# Patient Record
Sex: Female | Born: 1985 | Race: White | Hispanic: No | Marital: Single | Smoking: Never smoker
Health system: Southern US, Community
[De-identification: ages and names within clinical notes are randomized; demographics above are authoritative.]

## PROBLEM LIST (undated history)

## (undated) DIAGNOSIS — F419 Anxiety disorder, unspecified: Secondary | ICD-10-CM

## (undated) HISTORY — PX: CHOLECYSTECTOMY: SHX55

## (undated) HISTORY — DX: Anxiety disorder, unspecified: F41.9

---

## 2004-12-31 ENCOUNTER — Ambulatory Visit: Payer: Self-pay | Admitting: Internal Medicine

## 2005-02-20 ENCOUNTER — Encounter: Payer: Self-pay | Admitting: Physician Assistant

## 2005-03-04 ENCOUNTER — Encounter: Payer: Self-pay | Admitting: Physician Assistant

## 2005-04-04 ENCOUNTER — Encounter: Payer: Self-pay | Admitting: Physician Assistant

## 2007-06-29 ENCOUNTER — Observation Stay: Payer: Self-pay | Admitting: Obstetrics & Gynecology

## 2007-08-18 ENCOUNTER — Observation Stay: Payer: Self-pay

## 2007-08-23 ENCOUNTER — Inpatient Hospital Stay: Payer: Self-pay | Admitting: Obstetrics and Gynecology

## 2008-09-08 ENCOUNTER — Ambulatory Visit: Payer: Self-pay | Admitting: Family Medicine

## 2008-09-14 ENCOUNTER — Ambulatory Visit: Payer: Self-pay | Admitting: Family Medicine

## 2008-09-21 ENCOUNTER — Ambulatory Visit: Payer: Self-pay | Admitting: General Surgery

## 2008-10-03 ENCOUNTER — Ambulatory Visit: Payer: Self-pay | Admitting: General Surgery

## 2008-10-03 IMAGING — CR DG CHOLANGIOGRAM OPERATIVE
1 series · 1 of 1 positions shown · non-contrast
Comparison: none

REASON FOR EXAM: cholelithiasis
COMMENTS:

[imported/digitized images]
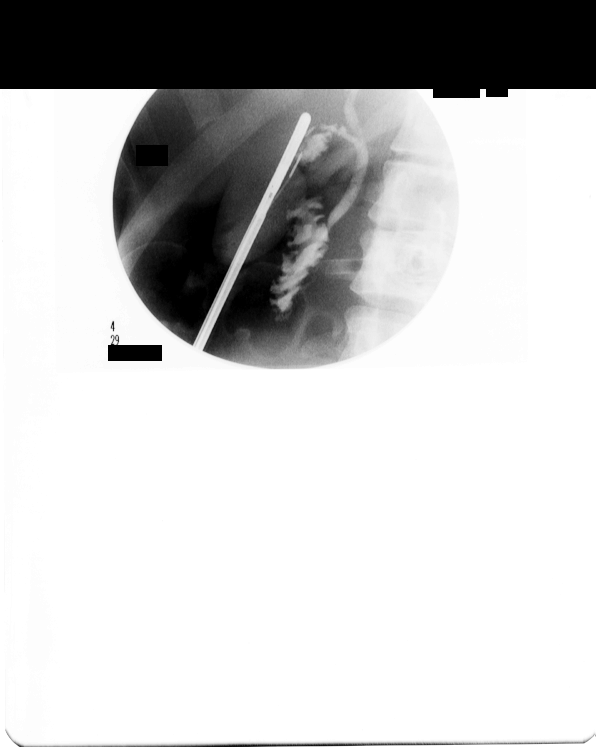

[1 of 1 positions shown; findings below may reference images not displayed]

PROCEDURE:     DXR - DXR CHOLANGIOGRAM OP (INITIAL)  - October 03, 2008 [DATE]

RESULT:     Contrast material is visualized in the hepatic ducts and common
duct. No definite retained stone is seen. There are a few lucencies
projected over the lower common duct which are thought to the artifactual.
Contrast is noted to flow into the duodenum without evidence of obstruction.
IMPRESSION: Normal operative sonogram.

## 2009-03-08 ENCOUNTER — Emergency Department: Payer: Self-pay | Admitting: Emergency Medicine

## 2009-08-23 ENCOUNTER — Inpatient Hospital Stay (HOSPITAL_COMMUNITY): Admission: RE | Admit: 2009-08-23 | Discharge: 2009-08-27 | Payer: Self-pay | Admitting: Obstetrics and Gynecology

## 2009-09-16 IMAGING — US ABDOMEN ULTRASOUND
1 series · 2 of 2 positions shown · non-contrast
Comparison: none

REASON FOR EXAM: abdominal pain   RUQ pain  back pain    Shortness of
breath    chest pain   eval reflux
COMMENTS:

[Series 1: abdomen ultrasound · 2 of 2 slices shown]
[im 1/2]
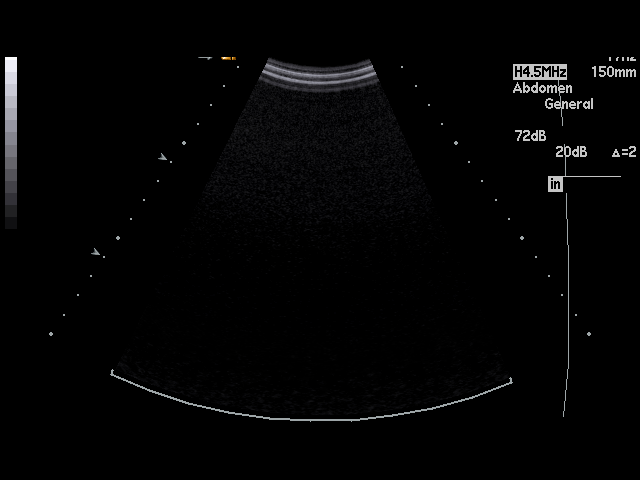
[im 2/2]
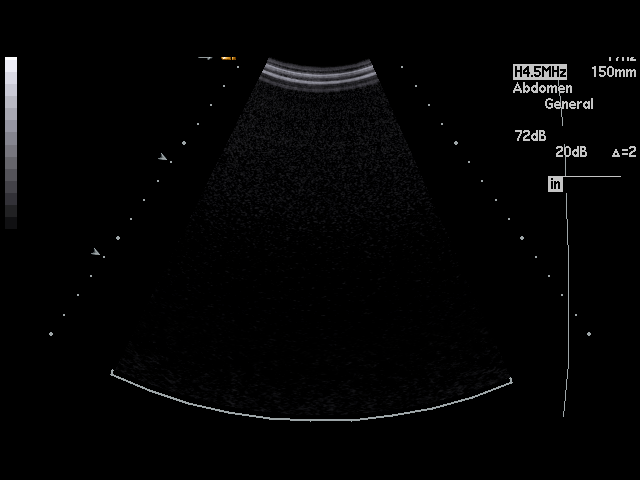

[2 of 2 positions shown; findings below may reference images not displayed]

PROCEDURE:     US  - US ABDOMEN GENERAL SURVEY  - September 08, 2008  [DATE]

RESULT:     Ultrasound of the abdomen demonstrates atherosclerotic
irregularity in the aorta but no aneurysm. The pancreas, spleen, liver,
kidneys and common bile duct appear to be unremarkable. The common bile duct
diameter is 4.4 mm. There are mobile shadowing stones within the gallbladder.
IMPRESSION: 1. Cholelithiasis.
2. No other significant abnormality.

## 2011-02-07 LAB — CBC
HCT: 29 % — ABNORMAL LOW (ref 36.0–46.0)
HCT: 34.5 % — ABNORMAL LOW (ref 36.0–46.0)
Hemoglobin: 11.5 g/dL — ABNORMAL LOW (ref 12.0–15.0)
Hemoglobin: 9.6 g/dL — ABNORMAL LOW (ref 12.0–15.0)
MCHC: 33.1 g/dL (ref 30.0–36.0)
MCHC: 33.3 g/dL (ref 30.0–36.0)
MCV: 80.1 fL (ref 78.0–100.0)
MCV: 80.8 fL (ref 78.0–100.0)
Platelets: 213 10*3/uL (ref 150–400)
RBC: 4.31 MIL/uL (ref 3.87–5.11)
RDW: 14.8 % (ref 11.5–15.5)

## 2016-07-02 ENCOUNTER — Ambulatory Visit: Payer: Self-pay | Admitting: Physician Assistant

## 2016-07-02 ENCOUNTER — Encounter: Payer: Self-pay | Admitting: Physician Assistant

## 2016-07-02 VITALS — BP 110/70 | HR 82 | Temp 98.9°F

## 2016-07-02 DIAGNOSIS — W57XXXA Bitten or stung by nonvenomous insect and other nonvenomous arthropods, initial encounter: Secondary | ICD-10-CM
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
## 2016-07-03 ENCOUNTER — Other Ambulatory Visit: Payer: Self-pay | Admitting: Physician Assistant

## 2016-07-03 DIAGNOSIS — Z299 Encounter for prophylactic measures, unspecified: Secondary | ICD-10-CM

## 2016-07-03 NOTE — Progress Notes (Signed)
Patient came in to have blood drawn per Susan's orders. 

## 2016-07-04 LAB — CMP12+LP+TP+TSH+6AC+CBC/D/PLT
A/G RATIO: 1.5 (ref 1.2–2.2)
ALBUMIN: 4.4 g/dL (ref 3.5–5.5)
ALK PHOS: 76 IU/L (ref 39–117)
ALT: 28 IU/L (ref 0–32)
AST: 23 IU/L (ref 0–40)
BASOS ABS: 0 10*3/uL (ref 0.0–0.2)
BASOS: 0 %
BILIRUBIN TOTAL: 0.2 mg/dL (ref 0.0–1.2)
BUN / CREAT RATIO: 14 (ref 9–23)
BUN: 13 mg/dL (ref 6–20)
CHLORIDE: 101 mmol/L (ref 96–106)
CHOLESTEROL TOTAL: 184 mg/dL (ref 100–199)
Calcium: 9.5 mg/dL (ref 8.7–10.2)
Chol/HDL Ratio: 4.7 ratio units — ABNORMAL HIGH (ref 0.0–4.4)
Creatinine, Ser: 0.9 mg/dL (ref 0.57–1.00)
EOS (ABSOLUTE): 0.2 10*3/uL (ref 0.0–0.4)
EOS: 3 %
ESTIMATED CHD RISK: 1.2 times avg. — AB (ref 0.0–1.0)
FREE THYROXINE INDEX: 1.8 (ref 1.2–4.9)
GFR calc non Af Amer: 85 mL/min/{1.73_m2} (ref >59)
GFR, EST AFRICAN AMERICAN: 98 mL/min/{1.73_m2} (ref >59)
GGT: 26 IU/L (ref 0–60)
GLUCOSE: 97 mg/dL (ref 65–99)
Globulin, Total: 2.9 g/dL (ref 1.5–4.5)
HDL: 39 mg/dL — AB (ref >39)
HEMATOCRIT: 43.3 % (ref 34.0–46.6)
HEMOGLOBIN: 14.9 g/dL (ref 11.1–15.9)
IMMATURE GRANS (ABS): 0 10*3/uL (ref 0.0–0.1)
IMMATURE GRANULOCYTES: 1 %
Iron: 67 ug/dL (ref 27–159)
LDH: 168 IU/L (ref 119–226)
LDL CALC: 130 mg/dL — AB (ref 0–99)
LYMPHS: 27 %
Lymphocytes Absolute: 2.1 10*3/uL (ref 0.7–3.1)
MCH: 30.9 pg (ref 26.6–33.0)
MCHC: 34.4 g/dL (ref 31.5–35.7)
MCV: 90 fL (ref 79–97)
MONOCYTES: 7 %
Monocytes Absolute: 0.5 10*3/uL (ref 0.1–0.9)
NEUTROS PCT: 62 %
Neutrophils Absolute: 4.9 10*3/uL (ref 1.4–7.0)
PLATELETS: 260 10*3/uL (ref 150–379)
Phosphorus: 3.6 mg/dL (ref 2.5–4.5)
Potassium: 4.9 mmol/L (ref 3.5–5.2)
RBC: 4.82 x10E6/uL (ref 3.77–5.28)
RDW: 12.9 % (ref 12.3–15.4)
SODIUM: 140 mmol/L (ref 134–144)
T3 UPTAKE RATIO: 24 % (ref 24–39)
T4, Total: 7.5 ug/dL (ref 4.5–12.0)
TSH: 3.21 u[IU]/mL (ref 0.450–4.500)
Total Protein: 7.3 g/dL (ref 6.0–8.5)
Triglycerides: 74 mg/dL (ref 0–149)
URIC ACID: 4.4 mg/dL (ref 2.5–7.1)
VLDL CHOLESTEROL CAL: 15 mg/dL (ref 5–40)
WBC: 7.8 10*3/uL (ref 3.4–10.8)

## 2016-07-04 LAB — VITAMIN D 25 HYDROXY (VIT D DEFICIENCY, FRACTURES): Vit D, 25-Hydroxy: 26.3 ng/mL — ABNORMAL LOW (ref 30.0–100.0)

## 2017-04-02 ENCOUNTER — Ambulatory Visit (INDEPENDENT_AMBULATORY_CARE_PROVIDER_SITE_OTHER): Payer: Managed Care, Other (non HMO) | Admitting: Family Medicine

## 2017-04-02 ENCOUNTER — Encounter: Payer: Self-pay | Admitting: Family Medicine

## 2017-04-02 VITALS — BP 128/88 | HR 82 | Temp 97.7°F | Ht 65.5 in | Wt 187.0 lb

## 2017-04-02 DIAGNOSIS — Z124 Encounter for screening for malignant neoplasm of cervix: Secondary | ICD-10-CM | POA: Diagnosis not present

## 2017-04-02 DIAGNOSIS — Z23 Encounter for immunization: Secondary | ICD-10-CM | POA: Diagnosis not present

## 2017-04-02 DIAGNOSIS — Z0000 Encounter for general adult medical examination without abnormal findings: Secondary | ICD-10-CM

## 2017-04-02 DIAGNOSIS — Z7689 Persons encountering health services in other specified circumstances: Secondary | ICD-10-CM

## 2017-04-02 LAB — UA/M W/RFLX CULTURE, ROUTINE
BILIRUBIN UA: NEGATIVE
GLUCOSE, UA: NEGATIVE
Ketones, UA: NEGATIVE
Leukocytes, UA: NEGATIVE
NITRITE UA: NEGATIVE
PH UA: 5.5 (ref 5.0–7.5)
PROTEIN UA: NEGATIVE
Specific Gravity, UA: 1.025 (ref 1.005–1.030)
UUROB: 0.2 mg/dL (ref 0.2–1.0)

## 2017-04-02 LAB — MICROSCOPIC EXAMINATION
BACTERIA UA: NONE SEEN
WBC UA: NONE SEEN /HPF (ref 0–?)

## 2017-04-02 NOTE — Patient Instructions (Signed)
Activate your mychart account to see lab results online, ask providers questions, request refills, schedule appointments, etc

## 2017-04-02 NOTE — Progress Notes (Signed)
BP 128/88   Pulse 82   Temp 97.7 F (36.5 C)   Ht 5' 5.5" (1.664 m)   Wt 187 lb (84.8 kg)   LMP 03/11/2017 (Exact Date)   SpO2 98%   BMI 30.65 kg/m    Subjective:    Patient ID: Debra Serrano, female    DOB: 11/13/1985, 31 y.o.   MRN: [id number]  HPI: Debra Severanceiffany F Leland is a 31 y.o. female presenting on 04/02/2017 for comprehensive medical examination. Current medical complaints include:none  Patient presents today to establish care. 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 Needing one plus pap smear. No medical problems or routine medications. No concerns today.   Depression Screen done today and results listed below:  [facility] 04/02/2017  Decreased Interest 0  Down, Depressed, Hopeless 0  PHQ - 2 Score 0    The patient does not have a history of falls. I did not complete a risk assessment for falls. A plan of care for falls was not documented.   Past Medical History:  Past Medical History:  Diagnosis Date  . Anxiety     Surgical History:  Past Surgical History:  Procedure Laterality Date  . CESAREAN SECTION     x 2  . CHOLECYSTECTOMY      Medications:  No current outpatient prescriptions on file prior to visit.   No current facility-administered medications on file prior to visit.     Allergies:  No Known Allergies  Social History:  Social History   Social History  . Marital status: Significant Other    Spouse name: N/A  . Number of children: N/A  . Years of education: N/A   Occupational History  . Not on file.   Social History Main Topics  . Smoking status: Never Smoker  . Smokeless tobacco: Never Used  . Alcohol use Yes     Comment: occasionally  . Drug use: No  . Sexual activity: Not on file   Other Topics Concern  . Not on file   Social History Narrative  . No narrative on file   History  Smoking Status  .  Never Smoker  Smokeless Tobacco  . Never Used   History  Alcohol Use  . Yes    Comment: occasionally    Family History:  Family History  Problem Relation Age of Onset  . Cancer Mother        melanoma  . COPD Mother   . Stroke Father   . Cancer Maternal Grandmother   . Cancer Maternal Grandfather   . Cancer Paternal Grandmother   . Cancer Paternal Grandfather   . Diabetes Neg Hx   . Heart disease Neg Hx   . Hypertension Neg Hx     Past medical history, surgical history, medications, allergies, family history and social history reviewed with patient today and changes made to appropriate areas of the chart.   Review of Systems - General ROS: negative Psychological ROS: negative Ophthalmic ROS: negative ENT ROS: negative Allergy and Immunology ROS: negative Hematological and Lymphatic ROS: negative Endocrine ROS: negative Breast ROS: negative for breast lumps Respiratory ROS: no cough, shortness of breath, or wheezing Cardiovascular ROS: no chest pain or dyspnea on exertion Gastrointestinal ROS: no abdominal pain, change in bowel habits, or black or bloody stools Genito-Urinary ROS: no dysuria, trouble voiding, or hematuria Musculoskeletal ROS: negative  Neurological ROS: no TIA or stroke symptoms Dermatological ROS: negative All other ROS negative except what is listed above and in the HPI.      Objective:    BP 128/88   Pulse 82   Temp 97.7 F (36.5 C)   Ht 5' 5.5" (1.664 m)   Wt 187 lb (84.8 kg)   LMP 03/11/2017 (Exact Date)   SpO2 98%   BMI 30.65 kg/m   Wt Readings from Last 3 Encounters:  04/02/17 187 lb (84.8 kg)    Physical Exam  Constitutional: She is oriented to person, place, and time. She appears well-developed and well-nourished. No distress.  HENT:  Head: Atraumatic.  Right Ear: External ear normal.  Left Ear: External ear normal.  Nose: Nose normal.  Mouth/Throat: Oropharynx is clear and moist. No oropharyngeal exudate.  Eyes: Conjunctivae  are normal. Pupils are equal, round, and reactive to light. No scleral icterus.  Neck: Normal range of motion. Neck supple. No thyromegaly present.  Cardiovascular: Normal rate, regular rhythm, normal heart sounds and intact distal pulses.   Pulmonary/Chest: Effort normal and breath sounds normal. No respiratory distress. Right breast exhibits no mass, no skin change and no tenderness. Left breast exhibits no mass, no skin change and no tenderness.  Abdominal: Soft. Bowel sounds are normal. She exhibits no distension. There is no tenderness. There is no rebound.  Genitourinary: Vagina normal and uterus normal.  Musculoskeletal: Normal range of motion. She exhibits no edema or tenderness.  Lymphadenopathy:    She has no cervical adenopathy.    She has no axillary adenopathy.  Neurological: She is alert and oriented to person, place, and time. No cranial nerve deficit.  Skin: Skin is warm and dry. No rash noted. No erythema.  Psychiatric: She has a normal mood and affect. Her behavior is normal.  Nursing note and vitals reviewed.     Assessment & Plan:   Problem List Items Addressed This Visit    None    Visit Diagnoses    Encounter to establish care    -  Primary   No concerns today. CPE completed   Annual physical exam       Pap and Td completed today, await basic labs.    Relevant Orders   CBC with Differential/Platelet   Comprehensive metabolic panel   Lipid Panel w/o Chol/HDL Ratio   TSH   UA/M w/rflx Culture, Routine   Screening for cervical cancer       Relevant Orders   IGP, Aptima HPV, rfx 16/18,45   Need for diphtheria-tetanus-pertussis (Tdap) vaccine       Relevant Orders   Tdap vaccine greater than or equal to 7yo IM (Completed)       Follow up plan: Return in about 1 year (around 04/02/2018) for CPE.   LABORATORY TESTING:  - Pap smear: pap done  IMMUNIZATIONS:   - Tdap: Tetanus vaccination status reviewed: Td vaccination indicated and given today. -  Influenza: Postponed to flu season  PATIENT COUNSELING:   Advised to take 1 mg of folate supplement per day if capable of pregnancy.   Sexuality: Discussed sexually transmitted diseases, partner selection, use of condoms, avoidance of unintended pregnancy  and contraceptive alternatives.   Advised to avoid cigarette smoking.  I discussed with the patient that most people either abstain from alcohol or drink within safe limits (<=14/week and <=4 drinks/occasion for males, <=7/weeks and <= 3 drinks/occasion for females) and that the risk for alcohol disorders and other health effects  rises proportionally with the number of drinks per week and how often a drinker exceeds daily limits.  Discussed cessation/primary prevention of drug use and availability of treatment for abuse.   Diet: Encouraged to adjust caloric intake to maintain  or achieve ideal body weight, to reduce intake of dietary saturated fat and total fat, to limit sodium intake by avoiding high sodium foods and not adding table salt, and to maintain adequate dietary potassium and calcium preferably from fresh fruits, vegetables, and low-fat dairy products.    stressed the importance of regular exercise  Injury prevention: Discussed safety belts, safety helmets, smoke detector, smoking near bedding or upholstery.   Dental health: Discussed importance of regular tooth brushing, flossing, and dental visits.    NEXT PREVENTATIVE PHYSICAL DUE IN 1 YEAR. Return in about 1 year (around 04/02/2018) for CPE.

## 2017-04-03 ENCOUNTER — Encounter: Payer: Self-pay | Admitting: Family Medicine

## 2017-04-03 LAB — COMPREHENSIVE METABOLIC PANEL
ALK PHOS: 88 IU/L (ref 39–117)
ALT: 30 IU/L (ref 0–32)
AST: 24 IU/L (ref 0–40)
Albumin/Globulin Ratio: 1.5 (ref 1.2–2.2)
Albumin: 4.4 g/dL (ref 3.5–5.5)
BILIRUBIN TOTAL: 0.3 mg/dL (ref 0.0–1.2)
BUN/Creatinine Ratio: 19 (ref 9–23)
BUN: 17 mg/dL (ref 6–20)
CO2: 23 mmol/L (ref 18–29)
CREATININE: 0.9 mg/dL (ref 0.57–1.00)
Calcium: 9.3 mg/dL (ref 8.7–10.2)
Chloride: 100 mmol/L (ref 96–106)
GFR calc non Af Amer: 85 mL/min/{1.73_m2} (ref >59)
GFR, EST AFRICAN AMERICAN: 98 mL/min/{1.73_m2} (ref >59)
GLOBULIN, TOTAL: 3 g/dL (ref 1.5–4.5)
Glucose: 93 mg/dL (ref 65–99)
Potassium: 4.3 mmol/L (ref 3.5–5.2)
SODIUM: 141 mmol/L (ref 134–144)
TOTAL PROTEIN: 7.4 g/dL (ref 6.0–8.5)

## 2017-04-03 LAB — CBC WITH DIFFERENTIAL/PLATELET
Basophils Absolute: 0 10*3/uL (ref 0.0–0.2)
Basos: 0 %
EOS (ABSOLUTE): 0.1 10*3/uL (ref 0.0–0.4)
EOS: 1 %
HEMOGLOBIN: 14.9 g/dL (ref 11.1–15.9)
Hematocrit: 42.9 % (ref 34.0–46.6)
Immature Grans (Abs): 0 10*3/uL (ref 0.0–0.1)
Immature Granulocytes: 0 %
LYMPHS ABS: 2.7 10*3/uL (ref 0.7–3.1)
Lymphs: 33 %
MCH: 30.6 pg (ref 26.6–33.0)
MCHC: 34.7 g/dL (ref 31.5–35.7)
MCV: 88 fL (ref 79–97)
MONOCYTES: 4 %
Monocytes Absolute: 0.4 10*3/uL (ref 0.1–0.9)
NEUTROS ABS: 5 10*3/uL (ref 1.4–7.0)
Neutrophils: 62 %
Platelets: 299 10*3/uL (ref 150–379)
RBC: 4.87 x10E6/uL (ref 3.77–5.28)
RDW: 13 % (ref 12.3–15.4)
WBC: 8.2 10*3/uL (ref 3.4–10.8)

## 2017-04-03 LAB — LIPID PANEL W/O CHOL/HDL RATIO
CHOLESTEROL TOTAL: 191 mg/dL (ref 100–199)
HDL: 43 mg/dL (ref >39)
LDL CALC: 133 mg/dL — AB (ref 0–99)
Triglycerides: 76 mg/dL (ref 0–149)
VLDL Cholesterol Cal: 15 mg/dL (ref 5–40)

## 2017-04-03 LAB — TSH: TSH: 3 u[IU]/mL (ref 0.450–4.500)

## 2017-04-04 LAB — IGP, APTIMA HPV, RFX 16/18,45
HPV APTIMA: NEGATIVE
PAP SMEAR COMMENT: 0

## 2017-04-30 ENCOUNTER — Ambulatory Visit: Payer: Self-pay | Admitting: Family Medicine

## 2017-11-03 ENCOUNTER — Other Ambulatory Visit: Payer: Self-pay | Admitting: Family

## 2017-11-03 ENCOUNTER — Ambulatory Visit: Payer: Self-pay | Admitting: Family

## 2017-11-03 DIAGNOSIS — J019 Acute sinusitis, unspecified: Secondary | ICD-10-CM

## 2017-11-03 DIAGNOSIS — H60502 Unspecified acute noninfective otitis externa, left ear: Secondary | ICD-10-CM

## 2017-11-03 MED ORDER — AMOXICILLIN 875 MG PO TABS: 875.0000 mg | ORAL_TABLET | Freq: Two times a day (BID) | ORAL | 0 refills | Status: DC

## 2017-11-03 MED ORDER — CIPROFLOXACIN-HYDROCORTISONE 0.2-1 % OT SUSP: 3.0000 [drp] | Freq: Two times a day (BID) | OTIC | 0 refills | Status: DC

## 2017-11-03 MED ORDER — CIPROFLOXACIN-FLUOCINOLONE PF 0.3-0.025 % OT SOLN: 0.2500 mL | Freq: Two times a day (BID) | OTIC | Status: DC

## 2017-11-03 NOTE — Addendum Note (Signed)
Addended by: Mirian MoMOORE, TOMMIE A on: 11/03/2017 01:46 PM   Modules accepted: Orders

## 2017-11-03 NOTE — Progress Notes (Signed)
Subjective: 31 year old WF complains of left ear pain about a month but has become worse the past few days with throbbing in her teeth; she reports having a bout of an upper respiratory infection initially with improvement then hurt left ear began hurting at a low level. She denies fever, headache, or difficulty eating or drinking  O: vital signs are stable,alert, healthy, pleasant ,no acute distress  ENT right TM is retracted; left EAC with excoriation and white whitish debris, canal is tender to touch and is painful with the manipulation of the earlobe, left preauricular tenderness, left TM is not visualized secondary to the debris, nasal mucosa extremely erythematous, swollen excoriated and with exudate, sinuses are not tender to palpation, oral cavity unremarkable, neck supple without adenopathy; heart RSR, lungs are clear  A/: Left otitis externa, acute Acute rhinosinusitis P/: Rx for Cipro otic twice a day 7 days with wick, amoxicillin 875 mg twice a day 10 days. Asking her to return for recheck at the completion of her antibiotics or as needed if symptoms are not improving.Supportive measures discussed. Follow up prn not improving

## 2018-03-12 ENCOUNTER — Telehealth: Payer: Self-pay | Admitting: Family Medicine

## 2018-03-12 NOTE — Telephone Encounter (Signed)
It is fine to have a physical, but she can also wait until after her baby is born. She will not have a pelvic exam here.

## 2018-03-12 NOTE — Telephone Encounter (Signed)
Copied from CRM [id number]. Topic: Appointment Scheduling - Scheduling Inquiry for Clinic >> Mar 12, 2018  3:41 PM Arlyss Gandy, NT wrote: Reason for CRM: Pt is [redacted] weeks pregnant and currently seeing a OB. Wants to be sure that she is ok to have a physical while pregnant or wait until the baby is born. Please advise.

## 2018-03-16 NOTE — Telephone Encounter (Signed)
LVM for patient to return call regarding information.

## 2018-03-17 NOTE — Telephone Encounter (Signed)
Called and left patient a VM asking for her to please return my call.  

## 2018-03-18 NOTE — Telephone Encounter (Signed)
Tried calling patient again. No answer. Did not leave another VM because me and [place] have left one. Patient has not returned phone call.  Letter?

## 2018-03-19 NOTE — Telephone Encounter (Signed)
-   As below

## 2018-03-19 NOTE — Telephone Encounter (Signed)
We can send a letter, or just wait until her appointment

## 2018-03-19 NOTE — Telephone Encounter (Signed)
Will wait for appointment as there are many voicemails left on phone. Appt 04/06/18 Patient can call back.

## 2018-04-06 ENCOUNTER — Encounter: Payer: Managed Care, Other (non HMO) | Admitting: Family Medicine

## 2018-10-07 DIAGNOSIS — Z30430 Encounter for insertion of intrauterine contraceptive device: Secondary | ICD-10-CM | POA: Insufficient documentation

## 2019-05-24 ENCOUNTER — Encounter: Payer: Managed Care, Other (non HMO) | Admitting: Adult Health

## 2019-05-24 ENCOUNTER — Encounter: Payer: Self-pay | Admitting: Adult Health

## 2019-06-08 ENCOUNTER — Encounter: Payer: Self-pay | Admitting: Adult Health

## 2019-06-08 ENCOUNTER — Other Ambulatory Visit: Payer: Self-pay

## 2019-06-08 ENCOUNTER — Ambulatory Visit: Payer: Managed Care, Other (non HMO) | Admitting: Adult Health

## 2019-06-08 VITALS — BP 120/78 | HR 72 | Temp 97.5°F | Resp 16 | Ht 66.0 in | Wt 196.0 lb

## 2019-06-08 DIAGNOSIS — Z0189 Encounter for other specified special examinations: Secondary | ICD-10-CM

## 2019-06-08 DIAGNOSIS — Z008 Encounter for other general examination: Secondary | ICD-10-CM

## 2019-06-08 NOTE — Patient Instructions (Signed)
The Biometric exam is a brief physical with labs including glucose and cholesterol. This does not replace a full physical with a primary care provider, and additional recommended labs. This is an acute care clinic not for maintenance of chronic or long standing conditions.   Provider also recommends if you do not have a primary care provider for patient to establish care promptly.You can choose any provider of your choice at any facility of your choice, the below information is  just a resource to aid in you finding a primary care provider for routine health maintenance.   [facility]  PHYSICIAN/PROVIDER  REFERRAL LINE at [phone]  [email or web address] to help assist with finding a primary care doctor.   Helpful resources below of other primary care office's accepting new patients.   Carlyon Prows         [address]  [place] [address] [phone]  . [facility]    [address], [place], [place] [address] [phone]  . [facility] [address] [place], [place]  [phone]   . [place] [facility]  [address] [place] [address] [phone]    Follow up with primary care as needed for chronic and maintenance health care- can be seen in this employee clinic for acute care.      Calorie Counting for Weight Loss Calories are units of energy. Your body needs a certain amount of calories from food to keep you going throughout the day. When you eat more calories than your body needs, your body stores the extra calories as fat. When you eat fewer calories than your body needs, your body burns fat to get the energy it needs. Calorie counting means keeping track of how many calories you eat and drink each day. Calorie counting can be helpful if you need to lose weight. If you make sure to eat fewer calories than your body needs, you should  lose weight. Ask your health care provider what a healthy weight is for you. For calorie counting to work, you will need to eat the right number of calories in a day in order to lose a healthy amount of weight per week. A dietitian can help you determine how many calories you need in a day and will give you suggestions on how to reach your calorie goal.  A healthy amount of weight to lose per week is usually 1-2 lb (0.5-0.9 kg). This usually means that your daily calorie intake should be reduced by 500-750 calories.  Eating 1,200 - 1,500 calories per day can help most women lose weight.  Eating 1,500 - 1,800 calories per day can help most men lose weight. What is my plan? My goal is to have __________ calories per day. If I have this many calories per day, I should lose around __________ pounds per week. What do I need to know about calorie counting? In order to meet your daily calorie goal, you will need to:  Find out how many calories are in each food you would like to eat. Try to do this before you eat.  Decide how much of the food you plan to eat.  Write down what you ate and how many calories it had. Doing this is called keeping a food log. To successfully lose weight, it is important to balance calorie counting with a healthy lifestyle that includes regular activity. Aim for  150 minutes of moderate exercise (such as walking) or 75 minutes of vigorous exercise (such as running) each week. Where do I find calorie information?  The number of calories in a food can be found on a Nutrition Facts label. If a food does not have a Nutrition Facts label, try to look up the calories online or ask your dietitian for help. Remember that calories are listed per serving. If you choose to have more than one serving of a food, you will have to multiply the calories per serving by the amount of servings you plan to eat. For example, the label on a package of bread might say that a serving size is 1 slice  and that there are 90 calories in a serving. If you eat 1 slice, you will have eaten 90 calories. If you eat 2 slices, you will have eaten 180 calories. How do I keep a food log? Immediately after each meal, record the following information in your food log:  What you ate. Don't forget to include toppings, sauces, and other extras on the food.  How much you ate. This can be measured in cups, ounces, or number of items.  How many calories each food and drink had.  The total number of calories in the meal. Keep your food log near you, such as in a small notebook in your pocket, or use a mobile app or website. Some programs will calculate calories for you and show you how many calories you have left for the day to meet your goal. What are some calorie counting tips?   Use your calories on foods and drinks that will fill you up and not leave you hungry: ? Some examples of foods that fill you up are nuts and nut butters, vegetables, lean proteins, and high-fiber foods like whole grains. High-fiber foods are foods with more than 5 g fiber per serving. ? Drinks such as sodas, specialty coffee drinks, alcohol, and juices have a lot of calories, yet do not fill you up.  Eat nutritious foods and avoid empty calories. Empty calories are calories you get from foods or beverages that do not have many vitamins or protein, such as candy, sweets, and soda. It is better to have a nutritious high-calorie food (such as an avocado) than a food with few nutrients (such as a bag of chips).  Know how many calories are in the foods you eat most often. This will help you calculate calorie counts faster.  Pay attention to calories in drinks. Low-calorie drinks include water and unsweetened drinks.  Pay attention to nutrition labels for "low fat" or "fat free" foods. These foods sometimes have the same amount of calories or more calories than the full fat versions. They also often have added sugar, starch, or salt, to  make up for flavor that was removed with the fat.  Find a way of tracking calories that works for you. Get creative. Try different apps or programs if writing down calories does not work for you. What are some portion control tips?  Know how many calories are in a serving. This will help you know how many servings of a certain food you can have.  Use a measuring cup to measure serving sizes. You could also try weighing out portions on a kitchen scale. With time, you will be able to estimate serving sizes for some foods.  Take some time to put servings of different foods on your favorite plates, bowls, and cups so you know  what a serving looks like.  Try not to eat straight from a bag or box. Doing this can lead to overeating. Put the amount you would like to eat in a cup or on a plate to make sure you are eating the right portion.  Use smaller plates, glasses, and bowls to prevent overeating.  Try not to multitask (for example, watch TV or use your computer) while eating. If it is time to eat, sit down at a table and enjoy your food. This will help you to know when you are full. It will also help you to be aware of what you are eating and how much you are eating. What are tips for following this plan? Reading food labels  Check the calorie count compared to the serving size. The serving size may be smaller than what you are used to eating.  Check the source of the calories. Make sure the food you are eating is high in vitamins and protein and low in saturated and trans fats. Shopping  Read nutrition labels while you shop. This will help you make healthy decisions before you decide to purchase your food.  Make a grocery list and stick to it. Cooking  Try to cook your favorite foods in a healthier way. For example, try baking instead of frying.  Use low-fat dairy products. Meal planning  Use more fruits and vegetables. Half of your plate should be fruits and vegetables.  Include  lean proteins like poultry and fish. How do I count calories when eating out?  Ask for smaller portion sizes.  Consider sharing an entree and sides instead of getting your own entree.  If you get your own entree, eat only half. Ask for a box at the beginning of your meal and put the rest of your entree in it so you are not tempted to eat it.  If calories are listed on the menu, choose the lower calorie options.  Choose dishes that include vegetables, fruits, whole grains, low-fat dairy products, and lean protein.  Choose items that are boiled, broiled, grilled, or steamed. Stay away from items that are buttered, battered, fried, or served with cream sauce. Items labeled "crispy" are usually fried, unless stated otherwise.  Choose water, low-fat milk, unsweetened iced tea, or other drinks without added sugar. If you want an alcoholic beverage, choose a lower calorie option such as a glass of wine or light beer.  Ask for dressings, sauces, and syrups on the side. These are usually high in calories, so you should limit the amount you eat.  If you want a salad, choose a garden salad and ask for grilled meats. Avoid extra toppings like bacon, cheese, or fried items. Ask for the dressing on the side, or ask for olive oil and vinegar or lemon to use as dressing.  Estimate how many servings of a food you are given. For example, a serving of cooked rice is  cup or about the size of half a baseball. Knowing serving sizes will help you be aware of how much food you are eating at restaurants. The list below tells you how big or small some common portion sizes are based on everyday objects: ? 1 oz-4 stacked dice. ? 3 oz-1 deck of cards. ? 1 tsp-1 die. ? 1 Tbsp- a ping-pong ball. ? 2 Tbsp-1 ping-pong ball. ?  cup- baseball. ? 1 cup-1 baseball. Summary  Calorie counting means keeping track of how many calories you eat and drink each day. If you  eat fewer calories than your body needs, you should  lose weight.  A healthy amount of weight to lose per week is usually 1-2 lb (0.5-0.9 kg). This usually means reducing your daily calorie intake by 500-750 calories.  The number of calories in a food can be found on a Nutrition Facts label. If a food does not have a Nutrition Facts label, try to look up the calories online or ask your dietitian for help.  Use your calories on foods and drinks that will fill you up, and not on foods and drinks that will leave you hungry.  Use smaller plates, glasses, and bowls to prevent overeating. This information is not intended to replace advice given to you by your health care provider. Make sure you discuss any questions you have with your health care provider. Document Released: 10/21/2005 Document Revised: 07/10/2018 Document Reviewed: 09/20/2016 Elsevier Patient Education  2020 [place] Following a healthy eating pattern may help you to achieve and maintain a healthy body weight, reduce the risk of chronic disease, and live a long and productive life. It is important to follow a healthy eating pattern at an appropriate calorie level for your body. Your nutritional needs should be met primarily through food by choosing a variety of nutrient-rich foods. What are tips for following this plan? Reading food labels  Read labels and choose the following: ? Reduced or low sodium. ? Juices with 100% fruit juice. ? Foods with low saturated fats and high polyunsaturated and monounsaturated fats. ? Foods with whole grains, such as whole wheat, cracked wheat, brown rice, and wild rice. ? Whole grains that are fortified with folic acid. This is recommended for women who are pregnant or who want to become pregnant.  Read labels and avoid the following: ? Foods with a lot of added sugars. These include foods that contain brown sugar, corn sweetener, corn syrup, dextrose, fructose, glucose, high-fructose corn syrup, honey, invert sugar, lactose,  malt syrup, maltose, molasses, raw sugar, sucrose, trehalose, or turbinado sugar.  Do not eat more than the following amounts of added sugar per day:  6 teaspoons (25 g) for women.  9 teaspoons (38 g) for men. ? Foods that contain processed or refined starches and grains. ? Refined grain products, such as white flour, degermed cornmeal, white bread, and white rice. Shopping  Choose nutrient-rich snacks, such as vegetables, whole fruits, and nuts. Avoid high-calorie and high-sugar snacks, such as potato chips, fruit snacks, and candy.  Use oil-based dressings and spreads on foods instead of solid fats such as butter, stick margarine, or cream cheese.  Limit pre-made sauces, mixes, and "instant" products such as flavored rice, instant noodles, and ready-made pasta.  Try more plant-protein sources, such as tofu, tempeh, black beans, edamame, lentils, nuts, and seeds.  Explore eating plans such as the Mediterranean diet or vegetarian diet. Cooking  Use oil to saut or stir-fry foods instead of solid fats such as butter, stick margarine, or lard.  Try baking, boiling, grilling, or broiling instead of frying.  Remove the fatty part of meats before cooking.  Steam vegetables in water or broth. Meal planning   At meals, imagine dividing your plate into fourths: ? One-half of your plate is fruits and vegetables. ? One-fourth of your plate is whole grains. ? One-fourth of your plate is protein, especially lean meats, poultry, eggs, tofu, beans, or nuts.  Include low-fat dairy as part of your daily diet. Lifestyle  Choose healthy options in all settings,  including home, work, school, restaurants, or stores.  Prepare your food safely: ? Wash your hands after handling raw meats. ? Keep food preparation surfaces clean by regularly washing with hot, soapy water. ? Keep raw meats separate from ready-to-eat foods, such as fruits and vegetables. ? Cook seafood, meat, poultry, and eggs to  the recommended internal temperature. ? Store foods at safe temperatures. In general:  Keep cold foods at 61F (4.4C) or below.  Keep hot foods at 161F (60C) or above.  Keep your freezer at [facility] (-17.8C) or below.  Foods are no longer safe to eat when they have been between the temperatures of 40-161F (4.4-60C) for more than 2 hours. What foods should I eat? Fruits Aim to eat 2 cup-equivalents of fresh, canned (in natural juice), or frozen fruits each day. Examples of 1 cup-equivalent of fruit include 1 small apple, 8 large strawberries, 1 cup canned fruit,  cup dried fruit, or 1 cup 100% juice. Vegetables Aim to eat 2-3 cup-equivalents of fresh and frozen vegetables each day, including different varieties and colors. Examples of 1 cup-equivalent of vegetables include 2 medium carrots, 2 cups raw, leafy greens, 1 cup chopped vegetable (raw or cooked), or 1 medium baked potato. Grains Aim to eat 6 ounce-equivalents of whole grains each day. Examples of 1 ounce-equivalent of grains include 1 slice of bread, 1 cup ready-to-eat cereal, 3 cups popcorn, or  cup cooked rice, pasta, or cereal. Meats and other proteins Aim to eat 5-6 ounce-equivalents of protein each day. Examples of 1 ounce-equivalent of protein include 1 egg, 1/2 cup nuts or seeds, or 1 tablespoon (16 g) peanut butter. A cut of meat or fish that is the size of a deck of cards is about 3-4 ounce-equivalents.  Of the protein you eat each week, try to have at least 8 ounces come from seafood. This includes salmon, trout, herring, and anchovies. Dairy Aim to eat 3 cup-equivalents of fat-free or low-fat dairy each day. Examples of 1 cup-equivalent of dairy include 1 cup (240 mL) milk, 8 ounces (250 g) yogurt, 1 ounces (44 g) natural cheese, or 1 cup (240 mL) fortified soy milk. Fats and oils  Aim for about 5 teaspoons (21 g) per day. Choose monounsaturated fats, such as canola and olive oils, avocados, peanut butter, and most  nuts, or polyunsaturated fats, such as sunflower, corn, and soybean oils, walnuts, pine nuts, sesame seeds, sunflower seeds, and flaxseed. Beverages  Aim for six 8-oz glasses of water per day. Limit coffee to three to five 8-oz cups per day.  Limit caffeinated beverages that have added calories, such as soda and energy drinks.  Limit alcohol intake to no more than 1 drink a day for nonpregnant women and 2 drinks a day for men. One drink equals 12 oz of beer (355 mL), 5 oz of wine (148 mL), or 1 oz of hard liquor (44 mL). Seasoning and other foods  Avoid adding excess amounts of salt to your foods. Try flavoring foods with herbs and spices instead of salt.  Avoid adding sugar to foods.  Try using oil-based dressings, sauces, and spreads instead of solid fats. This information is based on general [place] nutrition guidelines. For more information, visit [employer]. Exact amounts may vary based on your nutrition needs. Summary  A healthy eating plan may help you to maintain a healthy weight, reduce the risk of chronic diseases, and stay active throughout your life.  Plan your meals. Make sure you eat the right portions of  a variety of nutrient-rich foods.  Try baking, boiling, grilling, or broiling instead of frying.  Choose healthy options in all settings, including home, work, school, restaurants, or stores. This information is not intended to replace advice given to you by your health care provider. Make sure you discuss any questions you have with your health care provider. Document Released: 02/02/2018 Document Revised: 02/02/2018 Document Reviewed: 02/02/2018 Elsevier Patient Education  2020 [place] Maintenance, Female Adopting a healthy lifestyle and getting preventive care are important in promoting health and wellness. Ask your health care provider about:  The right schedule for you to have regular tests and exams.  Things you can do on your own to prevent  diseases and keep yourself healthy. What should I know about diet, weight, and exercise? Eat a healthy diet   Eat a diet that includes plenty of vegetables, fruits, low-fat dairy products, and lean protein.  Do not eat a lot of foods that are high in solid fats, added sugars, or sodium. Maintain a healthy weight Body mass index (BMI) is used to identify weight problems. It estimates body fat based on height and weight. Your health care provider can help determine your BMI and help you achieve or maintain a healthy weight. Get regular exercise Get regular exercise. This is one of the most important things you can do for your health. Most adults should:  Exercise for at least 150 minutes each week. The exercise should increase your heart rate and make you sweat (moderate-intensity exercise).  Do strengthening exercises at least twice a week. This is in addition to the moderate-intensity exercise.  Spend less time sitting. Even light physical activity can be beneficial. Watch cholesterol and blood lipids Have your blood tested for lipids and cholesterol at 35 years of age, then have this test every 5 years. Have your cholesterol levels checked more often if:  Your lipid or cholesterol levels are high.  You are older than 35 years of age.  You are at high risk for heart disease. What should I know about cancer screening? Depending on your health history and family history, you may need to have cancer screening at various ages. This may include screening for:  Breast cancer.  Cervical cancer.  Colorectal cancer.  Skin cancer.  Lung cancer. What should I know about heart disease, diabetes, and high blood pressure? Blood pressure and heart disease  High blood pressure causes heart disease and increases the risk of stroke. This is more likely to develop in people who have high blood pressure readings, are of African descent, or are overweight.  Have your blood pressure checked: ?  Every 3-5 years if you are 13-7 years of age. ? Every year if you are 61 years old or older. Diabetes Have regular diabetes screenings. This checks your fasting blood sugar level. Have the screening done:  Once every three years after age 74 if you are at a normal weight and have a low risk for diabetes.  More often and at a younger age if you are overweight or have a high risk for diabetes. What should I know about preventing infection? Hepatitis B If you have a higher risk for hepatitis B, you should be screened for this virus. Talk with your health care provider to find out if you are at risk for hepatitis B infection. Hepatitis C Testing is recommended for:  Everyone born from [birth year] through 1967.  Anyone with known risk factors for hepatitis C. Sexually transmitted infections (STIs)  Get screened for STIs, including gonorrhea and chlamydia, if: ? You are sexually active and are younger than 35 years of age. ? You are older than 35 years of age and your health care provider tells you that you are at risk for this type of infection. ? Your sexual activity has changed since you were last screened, and you are at increased risk for chlamydia or gonorrhea. Ask your health care provider if you are at risk.  Ask your health care provider about whether you are at high risk for HIV. Your health care provider may recommend a prescription medicine to help prevent HIV infection. If you choose to take medicine to prevent HIV, you should first get tested for HIV. You should then be tested every 3 months for as long as you are taking the medicine. Pregnancy  If you are about to stop having your period (premenopausal) and you may become pregnant, seek counseling before you get pregnant.  Take 400 to 800 micrograms (mcg) of folic acid every day if you become pregnant.  Ask for birth control (contraception) if you want to prevent pregnancy. Osteoporosis and menopause Osteoporosis is a disease in  which the bones lose minerals and strength with aging. This can result in bone fractures. If you are 72 years old or older, or if you are at risk for osteoporosis and fractures, ask your health care provider if you should:  Be screened for bone loss.  Take a calcium or vitamin D supplement to lower your risk of fractures.  Be given hormone replacement therapy (HRT) to treat symptoms of menopause. Follow these instructions at home: Lifestyle  Do not use any products that contain nicotine or tobacco, such as cigarettes, e-cigarettes, and chewing tobacco. If you need help quitting, ask your health care provider.  Do not use street drugs.  Do not share needles.  Ask your health care provider for help if you need support or information about quitting drugs. Alcohol use  Do not drink alcohol if: ? Your health care provider tells you not to drink. ? You are pregnant, may be pregnant, or are planning to become pregnant.  If you drink alcohol: ? Limit how much you use to 0-1 drink a day. ? Limit intake if you are breastfeeding.  Be aware of how much alcohol is in your drink. In the [place] one drink equals one 12 oz bottle of beer (355 mL), one 5 oz glass of wine (148 mL), or one 1 oz glass of hard liquor (44 mL). General instructions  Schedule regular health, dental, and eye exams.  Stay current with your vaccines.  Tell your health care provider if: ? You often feel depressed. ? You have ever been abused or do not feel safe at home. Summary  Adopting a healthy lifestyle and getting preventive care are important in promoting health and wellness.  Follow your health care provider's instructions about healthy diet, exercising, and getting tested or screened for diseases.  Follow your health care provider's instructions on monitoring your cholesterol and blood pressure. This information is not intended to replace advice given to you by your health care provider. Make sure you discuss  any questions you have with your health care provider. Document Released: 05/06/2011 Document Revised: 10/14/2018 Document Reviewed: 10/14/2018 Elsevier Patient Education  2020 [employer].

## 2019-06-08 NOTE — Progress Notes (Signed)
[facility]  JESSABELLE MARKIEWICZ DOB: 33 y.o. MRN: [id number]  Subjective:  Here for Biometric Screen/brief exam Patient is a 33 year old female in no acute distress who comes to the clinic for a biometric screening and brief exam.  She works with the [employer] [place] for [employer].  She is active, she does report weight gain with recent Covid 19 pandemic. She tries to eat healthy.  She has a one year old daughter at home.   Patient  denies any fever, body aches,chills, rash, chest pain, shortness of breath, nausea, vomiting, or diarrhea.   She has a primary care provider Volney American, PA-C   Objective: Blood pressure 120/78, pulse 72, temperature (!) 97.5 F (36.4 C), temperature source Temporal, resp. rate 16, height 5\' 6"  (1.676 m), weight 196 lb (88.9 kg), SpO2 99 %. Touch less thermometer forehead reading 1 degree less so temperature actually 98.5.  NAD, alert, well developed, well nourished HEENT: Within normal limits Neck: Normal, supple  No adenopathy  Heart: Regular rate and rhythm Lungs: Clear to auscultation without any adventitious lung sounds  Patient moves on and off of exam table and in room without difficulty. Gait is normal in hall and in room. Patient is oriented to person place time and situation. Patient answers questions appropriately and engages in conversation.   Assessment: Biometric screen 1. Encounter for other general examination- brief biometric exam with biometric screening.    2. Encounter for biometric screening      Plan:  Follow up with primary care as needed for chronic and maintenance health care- can be seen in this employee clinic for acute care.   Fasting glucose and lipids. Discussed with patient that today's visit here is a limited biometric screening visit (not a comprehensive exam or management of any chronic problems) Discussed some health issues, including healthy eating  habits and exercise. Encouraged to follow-up with PCP for annual comprehensive preventive and wellness care (and if applicable, any chronic issues). Questions invited and answered.

## 2019-06-09 ENCOUNTER — Encounter: Payer: Self-pay | Admitting: Adult Health

## 2019-06-09 LAB — LIPID PANEL WITH LDL/HDL RATIO
Cholesterol, Total: 182 mg/dL (ref 100–199)
HDL: 37 mg/dL — ABNORMAL LOW (ref >39)
LDL Calculated: 118 mg/dL — ABNORMAL HIGH (ref 0–99)
LDl/HDL Ratio: 3.2 ratio (ref 0.0–3.2)
Triglycerides: 133 mg/dL (ref 0–149)
VLDL Cholesterol Cal: 27 mg/dL (ref 5–40)

## 2019-06-09 LAB — GLUCOSE, RANDOM: Glucose: 79 mg/dL (ref 65–99)

## 2019-08-19 ENCOUNTER — Ambulatory Visit (INDEPENDENT_AMBULATORY_CARE_PROVIDER_SITE_OTHER): Payer: Managed Care, Other (non HMO) | Admitting: Family Medicine

## 2019-08-19 ENCOUNTER — Encounter: Payer: Self-pay | Admitting: Family Medicine

## 2019-08-19 ENCOUNTER — Other Ambulatory Visit: Payer: Self-pay

## 2019-08-19 VITALS — BP 157/77 | HR 92 | Temp 98.3°F | Ht 65.28 in | Wt 201.2 lb

## 2019-08-19 DIAGNOSIS — I10 Essential (primary) hypertension: Secondary | ICD-10-CM | POA: Insufficient documentation

## 2019-08-19 DIAGNOSIS — F488 Other specified nonpsychotic mental disorders: Secondary | ICD-10-CM | POA: Diagnosis not present

## 2019-08-19 DIAGNOSIS — F419 Anxiety disorder, unspecified: Secondary | ICD-10-CM

## 2019-08-19 DIAGNOSIS — Z0000 Encounter for general adult medical examination without abnormal findings: Secondary | ICD-10-CM

## 2019-08-19 DIAGNOSIS — R4189 Other symptoms and signs involving cognitive functions and awareness: Secondary | ICD-10-CM

## 2019-08-19 MED ORDER — SERTRALINE HCL 50 MG PO TABS: 50.0000 mg | ORAL_TABLET | Freq: Every day | ORAL | 0 refills | Status: DC

## 2019-08-19 NOTE — Progress Notes (Signed)
BP (!) 157/77   Pulse 92   Temp 98.3 F (36.8 C)   Ht 5' 5.28" (1.658 m)   Wt 201 lb 4 oz (91.3 kg)   SpO2 98%   BMI 33.21 kg/m    Subjective:    Patient ID: Debra Serrano, female    DOB: 04/10/1986, 33 y.o.   MRN: [id number]  HPI: Debra Serrano is a 33 y.o. female presenting on 08/19/2019 for comprehensive medical examination. Current medical complaints include:see below  Brain fog intermittently since pregnancy last year. Forgot how to get home one day, will forget how to do her job at times, the names of her childre. Unsure if she's got a family history of Neurologic issues. Is legally blind in right eye, has astigmatism in both eyes so unsure if she's having sight changes. Does get lightheaded at times, hands and feet go numb. No falls, balance issues, dizziness, aphasia.   Has dealt with anxiety in the past, has gone through therapy, medication (as a teenager but does not recall what) and mental exercises but since pregnancy and COVID etc has trouble coping with her stress and anxiousness. Denies crying spells, severe mood swings, SI/HI but does feel agitated often.   Has dealt with high blood pressures in the past, but never went on medications and was lost to follow up for years since. Does not check at home. Notes she does tend to have some headaches at base of skull at times and some lightheadedness. Denies CP, SOB, syncope.   She currently lives with: Menopausal Symptoms: no  Depression Screen done today and results listed below:  Depression screen [facility] 2/9 08/19/2019 04/02/2017  Decreased Interest 1 0  Down, Depressed, Hopeless 1 0  PHQ - 2 Score 2 0  Altered sleeping 2 -  Tired, decreased energy 2 -  Change in appetite 2 -  Feeling bad or failure about yourself  2 -  Trouble concentrating 2 -  Moving slowly or fidgety/restless 0 -  Suicidal thoughts 0 -  PHQ-9 Score 12 -  Difficult doing work/chores Very difficult -    The patient does not have a history  of falls. I did complete a risk assessment for falls. A plan of care for falls was documented.   Past Medical History:  Past Medical History:  Diagnosis Date  . Anxiety     Surgical History:  Past Surgical History:  Procedure Laterality Date  . CESAREAN SECTION     x 2  . CHOLECYSTECTOMY      Medications:  Current Outpatient Medications on File Prior to Visit  Medication Sig  . acetaminophen (TYLENOL) 325 MG tablet Take by mouth.   No current facility-administered medications on file prior to visit.     Allergies:  Allergies  Allergen Reactions  . Hydrocodone-Acetaminophen Rash  . Oxycodone-Acetaminophen Rash  . Tape Rash    Social History:  Social History   Socioeconomic History  . Marital status: Significant Other    Spouse name: Not on file  . Number of children: Not on file  . Years of education: Not on file  . Highest education level: Not on file  Occupational History  . Not on file  Social Needs  . Financial resource strain: Not on file  . Food insecurity    Worry: Not on file    Inability: Not on file  . Transportation needs    Medical: Not on file    Non-medical: Not on file  Tobacco Use  .  Smoking status: Never Smoker  . Smokeless tobacco: Never Used  Substance and Sexual Activity  . Alcohol use: Yes    Comment: occasionally  . Drug use: No  . Sexual activity: Not on file  Lifestyle  . Physical activity    Days per week: Not on file    Minutes per session: Not on file  . Stress: Not on file  Relationships  . Social Herbalist on phone: Not on file    Gets together: Not on file    Attends religious service: Not on file    Active member of club or organization: Not on file    Attends meetings of clubs or organizations: Not on file    Relationship status: Not on file  . Intimate partner violence    Fear of current or ex partner: Not on file    Emotionally abused: Not on file    Physically abused: Not on file    Forced sexual  activity: Not on file  Other Topics Concern  . Not on file  Social History Narrative  . Not on file   Social History   Tobacco Use  Smoking Status Never Smoker  Smokeless Tobacco Never Used   Social History   Substance and Sexual Activity  Alcohol Use Yes   Comment: occasionally    Family History:  Family History  Problem Relation Age of Onset  . Cancer Mother        melanoma  . COPD Mother   . Stroke Father   . Cancer Maternal Grandmother   . Cancer Maternal Grandfather   . Cancer Paternal Grandmother   . Cancer Paternal Grandfather   . Diabetes Neg Hx   . Heart disease Neg Hx   . Hypertension Neg Hx     Past medical history, surgical history, medications, allergies, family history and social history reviewed with patient today and changes made to appropriate areas of the chart.   Review of Systems - General ROS: negative Psychological ROS: positive for - anxiety, disorientation, irritability and memory difficulties Ophthalmic ROS: visual issues but at baseline ENT ROS: negative Allergy and Immunology ROS: negative Hematological and Lymphatic ROS: negative Endocrine ROS: negative Breast ROS: negative for breast lumps Respiratory ROS: no cough, shortness of breath, or wheezing Cardiovascular ROS: no chest pain or dyspnea on exertion Gastrointestinal ROS: no abdominal pain, change in bowel habits, or black or bloody stools Genito-Urinary ROS: no dysuria, trouble voiding, or hematuria Musculoskeletal ROS: negative Neurological ROS: positive for - headaches, memory loss and numbness/tingling Dermatological ROS: negative All other ROS negative except what is listed above and in the HPI.      Objective:    BP (!) 157/77   Pulse 92   Temp 98.3 F (36.8 C)   Ht 5' 5.28" (1.658 m)   Wt 201 lb 4 oz (91.3 kg)   SpO2 98%   BMI 33.21 kg/m   Wt Readings from Last 3 Encounters:  08/19/19 201 lb 4 oz (91.3 kg)  06/08/19 196 lb (88.9 kg)  04/02/17 187 lb (84.8 kg)     Physical Exam Vitals signs and nursing note reviewed.  Constitutional:      General: She is not in acute distress.    Appearance: She is well-developed.  HENT:     Head: Atraumatic.     Right Ear: External ear normal.     Left Ear: External ear normal.     Nose: Nose normal.     Mouth/Throat:  Pharynx: No oropharyngeal exudate.  Eyes:     General: No scleral icterus.    Conjunctiva/sclera: Conjunctivae normal.     Pupils: Pupils are equal, round, and reactive to light.  Neck:     Musculoskeletal: Normal range of motion and neck supple.     Thyroid: No thyromegaly.  Cardiovascular:     Rate and Rhythm: Normal rate and regular rhythm.     Heart sounds: Normal heart sounds.  Pulmonary:     Effort: Pulmonary effort is normal. No respiratory distress.     Breath sounds: Normal breath sounds.  Abdominal:     General: Bowel sounds are normal.     Palpations: Abdomen is soft. There is no mass.     Tenderness: There is no abdominal tenderness.  Musculoskeletal: Normal range of motion.        General: No tenderness.  Lymphadenopathy:     Cervical: No cervical adenopathy.  Skin:    General: Skin is warm and dry.     Findings: No rash.  Neurological:     General: No focal deficit present.     Mental Status: She is alert and oriented to person, place, and time.     Cranial Nerves: No cranial nerve deficit.     Sensory: No sensory deficit.     Motor: No weakness.     Coordination: Coordination normal.     Gait: Gait normal.     Deep Tendon Reflexes: Reflexes normal.  Psychiatric:        Behavior: Behavior normal.     Results for orders placed or performed in visit on 08/19/19  Microscopic Examination   URINE  Result Value Ref Range   WBC, UA 0-5 0 - 5 /hpf   RBC None seen 0 - 2 /hpf   Epithelial Cells (non renal) 0-10 0 - 10 /hpf   Bacteria, UA Many (A) None seen/Few  Urine Culture, Reflex   URINE  Result Value Ref Range   Urine Culture, Routine WILL FOLLOW    UA/M w/rflx Culture, Routine   Specimen: Urine   URINE  Result Value Ref Range   Specific Gravity, UA 1.025 1.005 - 1.030   pH, UA 5.5 5.0 - 7.5   Color, UA Yellow Yellow   Appearance Ur Cloudy (A) Clear   Leukocytes,UA Trace (A) Negative   Protein,UA Negative Negative/Trace   Glucose, UA Negative Negative   Ketones, UA Negative Negative   RBC, UA Trace (A) Negative   Bilirubin, UA Negative Negative   Urobilinogen, Ur 0.2 0.2 - 1.0 mg/dL   Nitrite, UA Positive (A) Negative   Microscopic Examination See below:    Urinalysis Reflex Comment       Assessment & Plan:   Problem List Items Addressed This Visit      Cardiovascular and Mediastinum   Essential hypertension - Primary    Per patient, hx of high blood pressures since her 20s. Script given for home BP monitor, log readings. DASH diet, exercise, work on stress reduction. F/u in 1 month        Other   Anxiety    Will start zoloft given severity, and packet given for local counselors and she will consider this and call if wanting to see someone. Discussed supportive care, recheck in 1 month      Relevant Medications   sertraline (ZOLOFT) 50 MG tablet    Other Visit Diagnoses    Annual physical exam       Relevant Orders  CBC with Differential/Platelet   Comprehensive metabolic panel   Lipid Panel w/o Chol/HDL Ratio   TSH   UA/M w/rflx Culture, Routine (Completed)   Brain fog       Will refer to Neurology for workup given sxs, though suspect more stress related at this time. WIll tx anxiety while awaiting this consultation   Relevant Orders   Ambulatory referral to Neurology       Follow up plan: Return in about 4 weeks (around 09/16/2019) for Anxiety, HTN f/u.   LABORATORY TESTING:  - Pap smear: up to date  IMMUNIZATIONS:   - Tdap: Tetanus vaccination status reviewed: last tetanus booster within 10 years. - Influenza: Up to date  PATIENT COUNSELING:   Advised to take 1 mg of folate supplement per day  if capable of pregnancy.   Sexuality: Discussed sexually transmitted diseases, partner selection, use of condoms, avoidance of unintended pregnancy  and contraceptive alternatives.   Advised to avoid cigarette smoking.  I discussed with the patient that most people either abstain from alcohol or drink within safe limits (<=14/week and <=4 drinks/occasion for males, <=7/weeks and <= 3 drinks/occasion for females) and that the risk for alcohol disorders and other health effects rises proportionally with the number of drinks per week and how often a drinker exceeds daily limits.  Discussed cessation/primary prevention of drug use and availability of treatment for abuse.   Diet: Encouraged to adjust caloric intake to maintain  or achieve ideal body weight, to reduce intake of dietary saturated fat and total fat, to limit sodium intake by avoiding high sodium foods and not adding table salt, and to maintain adequate dietary potassium and calcium preferably from fresh fruits, vegetables, and low-fat dairy products.    stressed the importance of regular exercise  Injury prevention: Discussed safety belts, safety helmets, smoke detector, smoking near bedding or upholstery.   Dental health: Discussed importance of regular tooth brushing, flossing, and dental visits.    NEXT PREVENTATIVE PHYSICAL DUE IN 1 YEAR. Return in about 4 weeks (around 09/16/2019) for Anxiety, HTN f/u.

## 2019-08-19 NOTE — Assessment & Plan Note (Signed)
Per patient, hx of high blood pressures since her 20s. Script given for home BP monitor, log readings. DASH diet, exercise, work on stress reduction. F/u in 1 month

## 2019-08-19 NOTE — Assessment & Plan Note (Signed)
Will start zoloft given severity, and packet given for local counselors and she will consider this and call if wanting to see someone. Discussed supportive care, recheck in 1 month

## 2019-08-20 LAB — CBC WITH DIFFERENTIAL/PLATELET
Basophils Absolute: 0.1 10*3/uL (ref 0.0–0.2)
Basos: 1 %
EOS (ABSOLUTE): 0.2 10*3/uL (ref 0.0–0.4)
Eos: 3 %
Hematocrit: 43.2 % (ref 34.0–46.6)
Hemoglobin: 15.1 g/dL (ref 11.1–15.9)
Immature Grans (Abs): 0 10*3/uL (ref 0.0–0.1)
Immature Granulocytes: 0 %
Lymphocytes Absolute: 3 10*3/uL (ref 0.7–3.1)
Lymphs: 37 %
MCH: 30.6 pg (ref 26.6–33.0)
MCHC: 35 g/dL (ref 31.5–35.7)
MCV: 88 fL (ref 79–97)
Monocytes Absolute: 0.5 10*3/uL (ref 0.1–0.9)
Monocytes: 6 %
Neutrophils Absolute: 4.3 10*3/uL (ref 1.4–7.0)
Neutrophils: 53 %
Platelets: 315 10*3/uL (ref 150–450)
RBC: 4.93 x10E6/uL (ref 3.77–5.28)
RDW: 12 % (ref 11.7–15.4)
WBC: 8.1 10*3/uL (ref 3.4–10.8)

## 2019-08-20 LAB — COMPREHENSIVE METABOLIC PANEL
ALT: 33 IU/L — ABNORMAL HIGH (ref 0–32)
AST: 25 IU/L (ref 0–40)
Albumin/Globulin Ratio: 1.7 (ref 1.2–2.2)
Albumin: 4.7 g/dL (ref 3.8–4.8)
Alkaline Phosphatase: 97 IU/L (ref 39–117)
BUN/Creatinine Ratio: 17 (ref 9–23)
BUN: 14 mg/dL (ref 6–20)
Bilirubin Total: 0.3 mg/dL (ref 0.0–1.2)
CO2: 24 mmol/L (ref 20–29)
Calcium: 9.7 mg/dL (ref 8.7–10.2)
Chloride: 101 mmol/L (ref 96–106)
Creatinine, Ser: 0.81 mg/dL (ref 0.57–1.00)
GFR calc Af Amer: 109 mL/min/{1.73_m2} (ref >59)
GFR calc non Af Amer: 94 mL/min/{1.73_m2} (ref >59)
Globulin, Total: 2.8 g/dL (ref 1.5–4.5)
Glucose: 88 mg/dL (ref 65–99)
Potassium: 4.2 mmol/L (ref 3.5–5.2)
Sodium: 138 mmol/L (ref 134–144)
Total Protein: 7.5 g/dL (ref 6.0–8.5)

## 2019-08-20 LAB — LIPID PANEL W/O CHOL/HDL RATIO
Cholesterol, Total: 183 mg/dL (ref 100–199)
HDL: 40 mg/dL (ref >39)
LDL Chol Calc (NIH): 123 mg/dL — ABNORMAL HIGH (ref 0–99)
Triglycerides: 110 mg/dL (ref 0–149)
VLDL Cholesterol Cal: 20 mg/dL (ref 5–40)

## 2019-08-20 LAB — TSH: TSH: 1.49 u[IU]/mL (ref 0.450–4.500)

## 2019-08-21 LAB — MICROSCOPIC EXAMINATION: RBC: NONE SEEN /hpf (ref 0–2)

## 2019-08-21 LAB — UA/M W/RFLX CULTURE, ROUTINE
Bilirubin, UA: NEGATIVE
Glucose, UA: NEGATIVE
Ketones, UA: NEGATIVE
Nitrite, UA: POSITIVE — AB
Protein,UA: NEGATIVE
Specific Gravity, UA: 1.025 (ref 1.005–1.030)
Urobilinogen, Ur: 0.2 mg/dL (ref 0.2–1.0)
pH, UA: 5.5 (ref 5.0–7.5)

## 2019-08-21 LAB — URINE CULTURE, REFLEX

## 2019-08-23 ENCOUNTER — Other Ambulatory Visit: Payer: Self-pay | Admitting: Family Medicine

## 2019-08-23 MED ORDER — SULFAMETHOXAZOLE-TRIMETHOPRIM 800-160 MG PO TABS
1.0000 | ORAL_TABLET | Freq: Two times a day (BID) | ORAL | 0 refills | Status: AC
Start: 2019-08-23 — End: ?

## 2019-09-16 ENCOUNTER — Ambulatory Visit: Payer: Managed Care, Other (non HMO) | Admitting: Family Medicine

## 2019-09-23 ENCOUNTER — Other Ambulatory Visit: Payer: Self-pay | Admitting: Family Medicine

## 2019-09-23 MED ORDER — SERTRALINE HCL 50 MG PO TABS: 50.0000 mg | ORAL_TABLET | Freq: Every day | ORAL | 0 refills | Status: DC

## 2019-09-23 NOTE — Telephone Encounter (Signed)
Routing to provider  

## 2019-10-26 ENCOUNTER — Other Ambulatory Visit: Payer: Self-pay | Admitting: Family Medicine

## 2019-11-10 ENCOUNTER — Other Ambulatory Visit: Payer: Self-pay | Admitting: Family Medicine

## 2019-11-10 NOTE — Telephone Encounter (Signed)
According to chart, medication is not due yet. Called pharmacy and they have a refill on file that the patient can pick up. Called and let the patient know that this was ready for her.   Please refuse RX.

## 2019-12-14 ENCOUNTER — Encounter: Payer: Self-pay | Admitting: Family Medicine

## 2020-01-07 ENCOUNTER — Other Ambulatory Visit: Payer: Self-pay | Admitting: Family Medicine

## 2020-02-03 ENCOUNTER — Other Ambulatory Visit: Payer: Self-pay | Admitting: Family Medicine

## 2020-03-15 ENCOUNTER — Other Ambulatory Visit: Payer: Self-pay | Admitting: Family Medicine

## 2020-03-15 NOTE — Telephone Encounter (Signed)
Overdue for appointment. Was supposed to have 1 month f/up from October visit. Please call and schedule f/up and then route to provider for refill.

## 2020-03-15 NOTE — Telephone Encounter (Signed)
Lvm to make this apt. 

## 2020-03-16 ENCOUNTER — Encounter: Payer: Self-pay | Admitting: Family Medicine

## 2020-03-16 NOTE — Telephone Encounter (Signed)
Message sent to pt through mychart

## 2020-03-17 NOTE — Telephone Encounter (Signed)
Lvm for pt to call back. Routing to close.

## 2020-05-31 ENCOUNTER — Other Ambulatory Visit: Payer: Self-pay

## 2020-05-31 ENCOUNTER — Encounter: Payer: Self-pay | Admitting: Emergency Medicine

## 2020-05-31 ENCOUNTER — Emergency Department
Admission: EM | Admit: 2020-05-31 | Discharge: 2020-05-31 | Disposition: A | Discharge status: Home / Self Care | Payer: Worker's Compensation | Attending: Emergency Medicine | Admitting: Emergency Medicine

## 2020-05-31 DIAGNOSIS — I10 Essential (primary) hypertension: Secondary | ICD-10-CM | POA: Insufficient documentation

## 2020-05-31 DIAGNOSIS — Z0283 Encounter for blood-alcohol and blood-drug test: Secondary | ICD-10-CM | POA: Diagnosis present

## 2020-05-31 LAB — URINE DRUG SCREEN, QUALITATIVE (ARMC ONLY)
Amphetamines, Ur Screen: NOT DETECTED
Barbiturates, Ur Screen: NOT DETECTED
Benzodiazepine, Ur Scrn: NOT DETECTED
Cannabinoid 50 Ng, Ur ~~LOC~~: NOT DETECTED
Cocaine Metabolite,Ur ~~LOC~~: NOT DETECTED
MDMA (Ecstasy)Ur Screen: NOT DETECTED
Methadone Scn, Ur: NOT DETECTED
Opiate, Ur Screen: NOT DETECTED
Phencyclidine (PCP) Ur S: NOT DETECTED
Tricyclic, Ur Screen: NOT DETECTED
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
## 2020-05-31 NOTE — ED Notes (Signed)
Pt states she needs urine drug screen test. Patient NAD noted.

## 2020-07-31 ENCOUNTER — Ambulatory Visit: Payer: Managed Care, Other (non HMO) | Admitting: Physician Assistant

## 2020-07-31 ENCOUNTER — Encounter: Payer: Self-pay | Admitting: Physician Assistant

## 2020-07-31 ENCOUNTER — Other Ambulatory Visit: Payer: Self-pay

## 2020-07-31 VITALS — BP 140/80 | HR 82 | Temp 97.9°F | Resp 17 | Ht 66.0 in | Wt 214.0 lb

## 2020-07-31 DIAGNOSIS — Z0000 Encounter for general adult medical examination without abnormal findings: Secondary | ICD-10-CM | POA: Diagnosis not present

## 2020-07-31 DIAGNOSIS — Z008 Encounter for other general examination: Secondary | ICD-10-CM | POA: Diagnosis not present

## 2020-07-31 NOTE — Patient Instructions (Signed)
B/P systolic 140. Suggested patient monitor diet closely. Ask that she continue exercise as scheduled. We discussed the need for "Me" time to be scheduled in her day. She will actively pursue a new primary MD.

## 2020-07-31 NOTE — Progress Notes (Signed)
° °  Subjective:    Patient ID: Debra Serrano, female    DOB: [birth year], 34 y.o.   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 She has been with the [employer] [facility] for 15 years. She has been monitoring her blood pressure as her blood pressure has been rising. She has a device at home, and is followed by PCP. Her PCP recently left the practice. She is seeking a new MD. No new medical problem. She states her diet is on and office. She is now part of a boot camp and exercises 5 days a week. States with job and children and husband she does not get to take much time for herself. Uses zoloft at times, but has been out of this medication for a few months because PCP left the practice.      Review of Systems  Constitutional: Negative for appetite change, diaphoresis, fatigue and fever.  HENT: Negative.   Eyes: Negative.   Respiratory: Negative.   Cardiovascular: Negative.   Gastrointestinal: Negative.   Genitourinary: Negative.   Musculoskeletal: Negative.   Neurological: Negative.   Psychiatric/Behavioral: Negative.        Objective:   Physical Exam Vitals and nursing note reviewed.  Constitutional:      Appearance: Normal appearance.  HENT:     Head: Normocephalic and atraumatic.     Right Ear: Tympanic membrane normal.     Left Ear: Tympanic membrane normal.     Mouth/Throat:     Mouth: Mucous membranes are moist.  Eyes:     Extraocular Movements: Extraocular movements intact.     Pupils: Pupils are equal, round, and reactive to light.  Cardiovascular:     Rate and Rhythm: Normal rate and regular rhythm.     Pulses: Normal pulses.     Heart sounds: Normal heart sounds.  Pulmonary:     Effort: Pulmonary effort is normal.     Breath sounds: Normal breath sounds.  Abdominal:     General: Abdomen is flat. Bowel sounds are normal.  Musculoskeletal:        General: Normal range of motion.     Cervical back: Normal range of motion  and neck supple.  Skin:    General: Skin is warm and dry.  Neurological:     General: No focal deficit present.     Mental Status: She is alert.  Psychiatric:        Mood and Affect: Mood normal.           Assessment & Plan:

## 2020-08-01 ENCOUNTER — Encounter: Payer: Self-pay | Admitting: Physician Assistant

## 2020-08-01 LAB — GLUCOSE, RANDOM: Glucose: 89 mg/dL (ref 65–99)

## 2020-08-01 LAB — LIPID PANEL
Chol/HDL Ratio: 4.8 ratio — ABNORMAL HIGH (ref 0.0–4.4)
Cholesterol, Total: 187 mg/dL (ref 100–199)
HDL: 39 mg/dL — ABNORMAL LOW (ref >39)
LDL Chol Calc (NIH): 127 mg/dL — ABNORMAL HIGH (ref 0–99)
Triglycerides: 118 mg/dL (ref 0–149)
VLDL Cholesterol Cal: 21 mg/dL (ref 5–40)

## 2021-08-21 ENCOUNTER — Other Ambulatory Visit: Payer: Self-pay | Admitting: Physician Assistant

## 2021-08-21 DIAGNOSIS — Z1231 Encounter for screening mammogram for malignant neoplasm of breast: Secondary | ICD-10-CM

## 2022-08-22 ENCOUNTER — Other Ambulatory Visit: Payer: Self-pay | Admitting: Physician Assistant

## 2022-08-22 DIAGNOSIS — Z1231 Encounter for screening mammogram for malignant neoplasm of breast: Secondary | ICD-10-CM

## 2023-07-18 ENCOUNTER — Other Ambulatory Visit: Payer: Self-pay | Admitting: Physician Assistant

## 2023-07-18 DIAGNOSIS — Z1231 Encounter for screening mammogram for malignant neoplasm of breast: Secondary | ICD-10-CM
